# Patient Record
Sex: Male | Born: 2005 | Race: White | Hispanic: Yes | Marital: Single
Health system: Southern US, Community
[De-identification: ages and names within clinical notes are randomized; demographics above are authoritative.]

## PROBLEM LIST (undated history)

## (undated) DIAGNOSIS — J302 Other seasonal allergic rhinitis: Secondary | ICD-10-CM

## (undated) DIAGNOSIS — J45909 Unspecified asthma, uncomplicated: Secondary | ICD-10-CM

## (undated) DIAGNOSIS — K219 Gastro-esophageal reflux disease without esophagitis: Secondary | ICD-10-CM

## (undated) HISTORY — PX: OTHER SURGICAL HISTORY: SHX169

---
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
## 2023-08-01 IMAGING — CT CT HEAD W/O CM
4 series · 16 of 47 positions shown, 18 images · non-contrast
Comparison: 08/22/2021

CLINICAL DATA: Subdural hematoma follow-up

EXAM:
CT HEAD WITHOUT CONTRAST
TECHNIQUE: Contiguous axial images were obtained from the base of the skull
through the vertex without intravenous contrast.

[Series 2: head wo · axial · 0.41mm/px · z∈[+1516,+1631]mm · 7 of 31 slices shown, 9 images]
[im 4/31  brain]
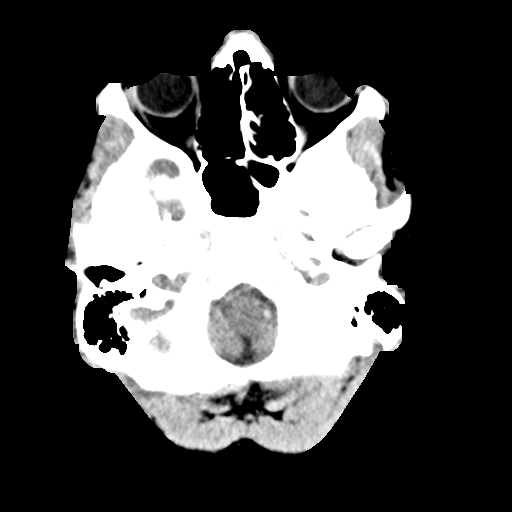
[im 4/31  bone]
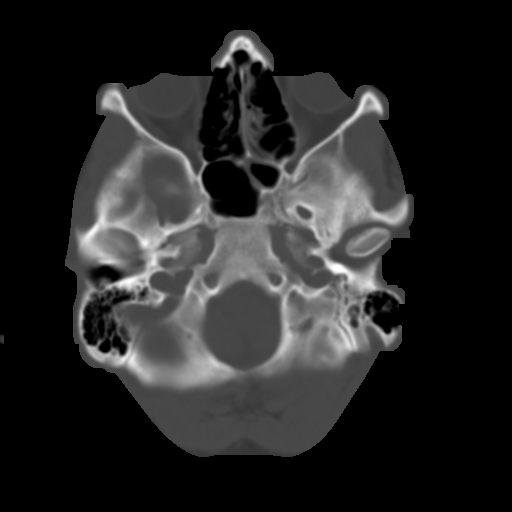
[im 8/31  brain]
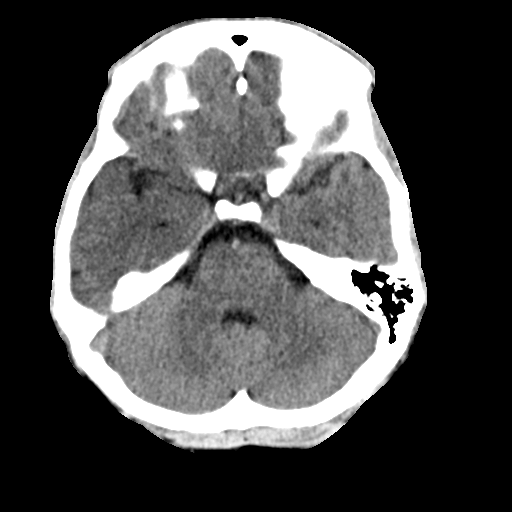
[im 12/31  brain]
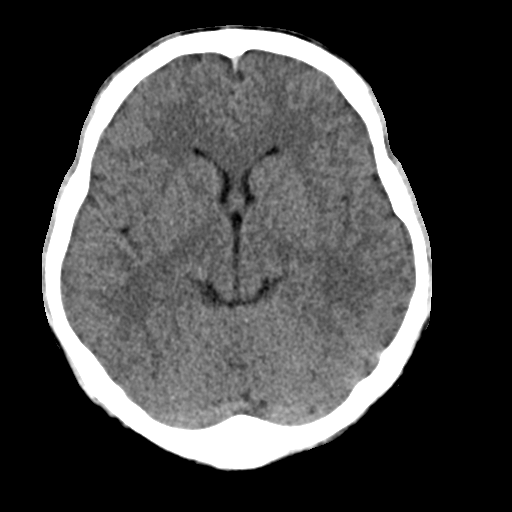
[im 16/31  brain]
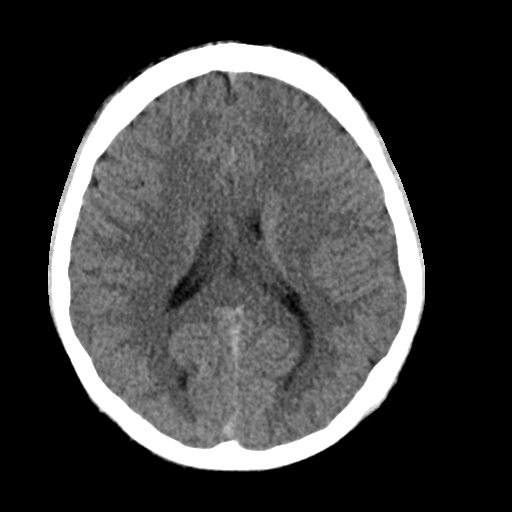
[im 19/31  brain]
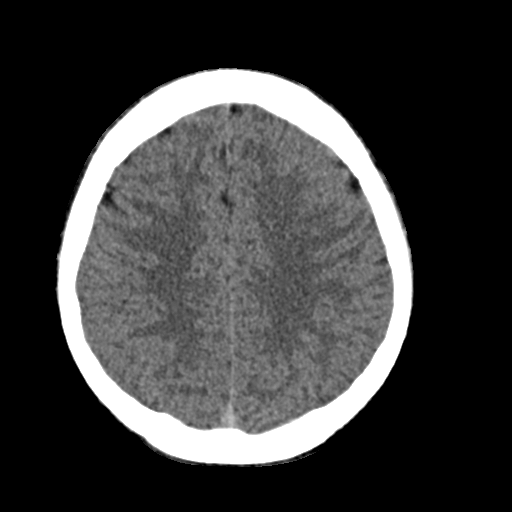
[im 19/31  bone]
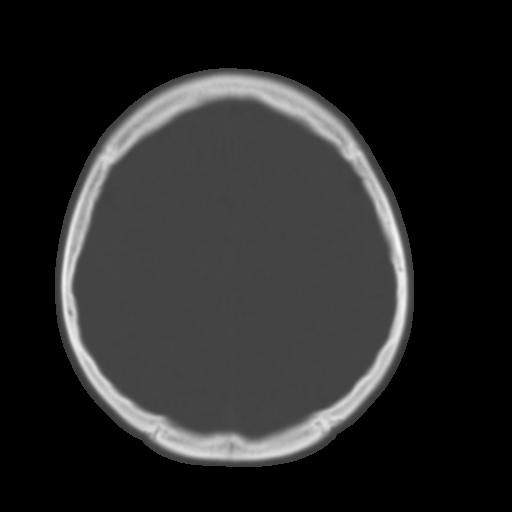
[im 23/31  brain]
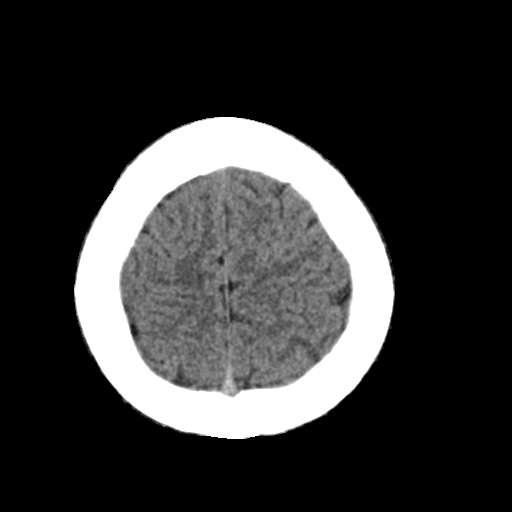
[im 27/31  brain]
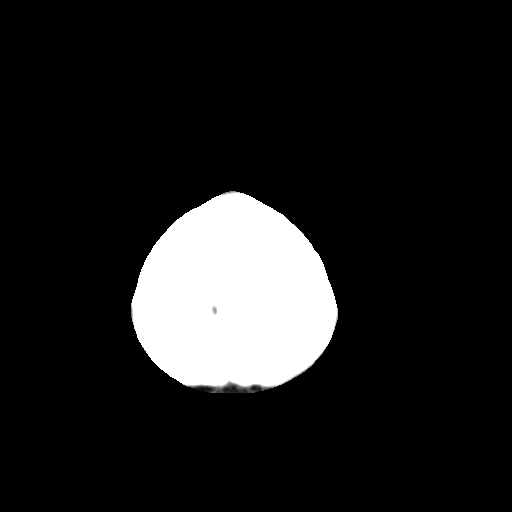

[Series 3: head bone · axial · 0.41mm/px · z∈[+1515,+1545]mm · 3 of 76 slices shown]
[im 8/76  bone]
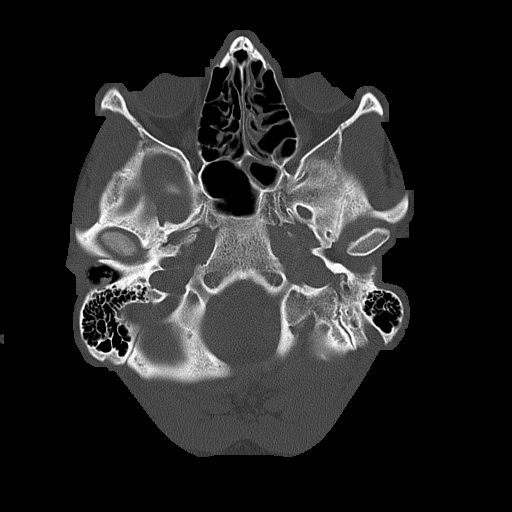
[im 16/76  bone]
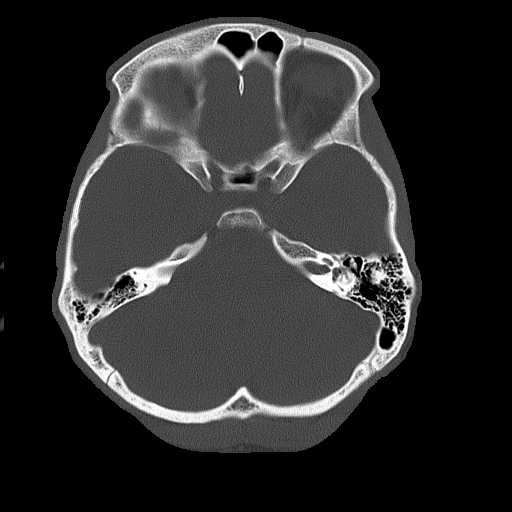
[im 23/76  bone]
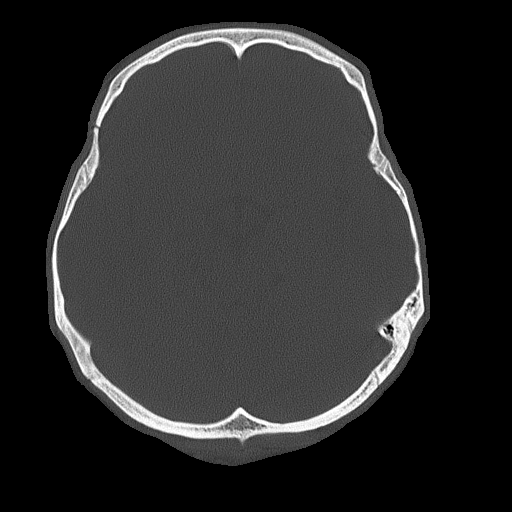

[Series 4: coronal soft tissue · coronal · 0.29mm/px · 3 of 67 slices shown]
[im 23/67  brain]
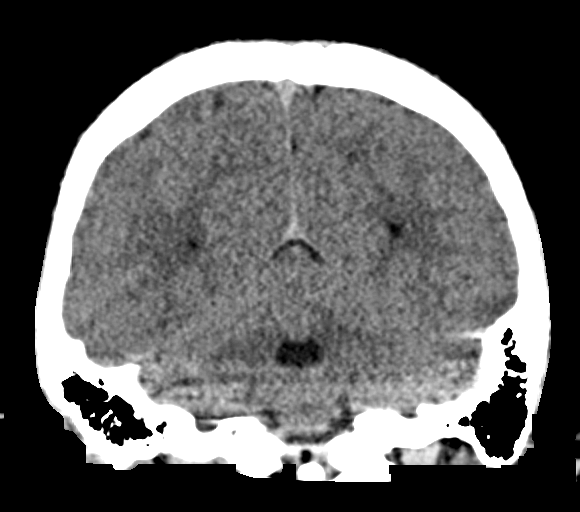
[im 30/67  brain]
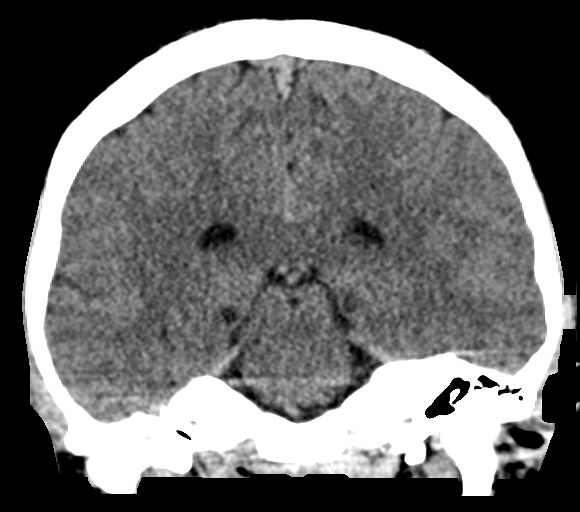
[im 37/67  brain]
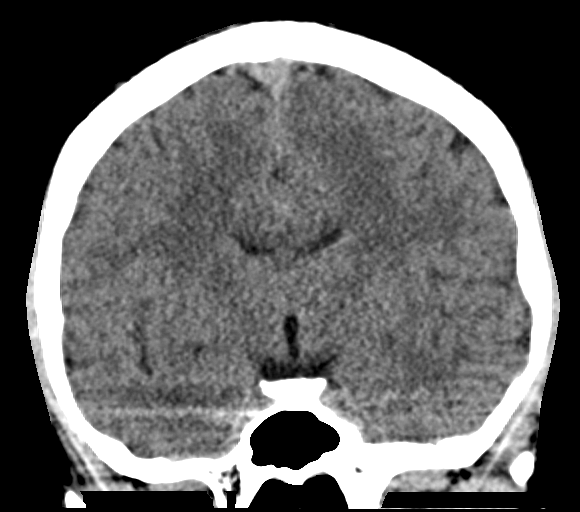

[Series 5: sagittal soft tissue · sagittal · 0.29mm/px · 3 of 57 slices shown]
[im 19/57  brain]
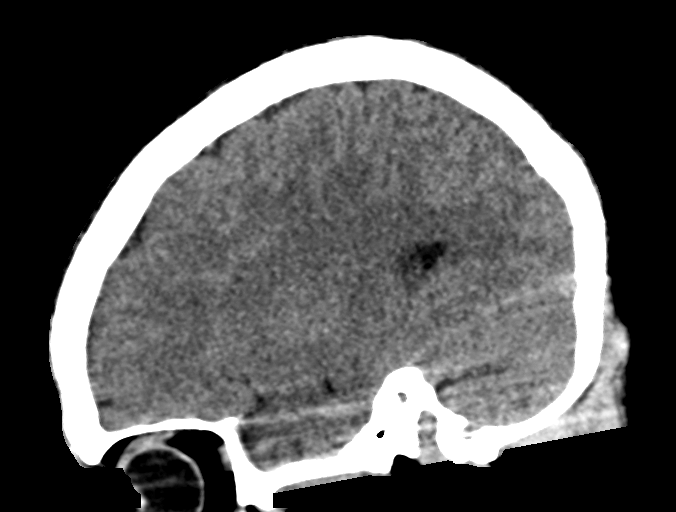
[im 29/57  brain]
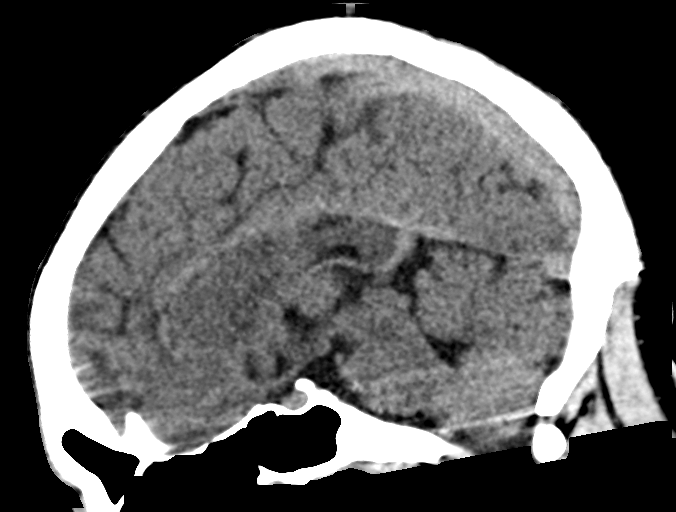
[im 38/57  brain]
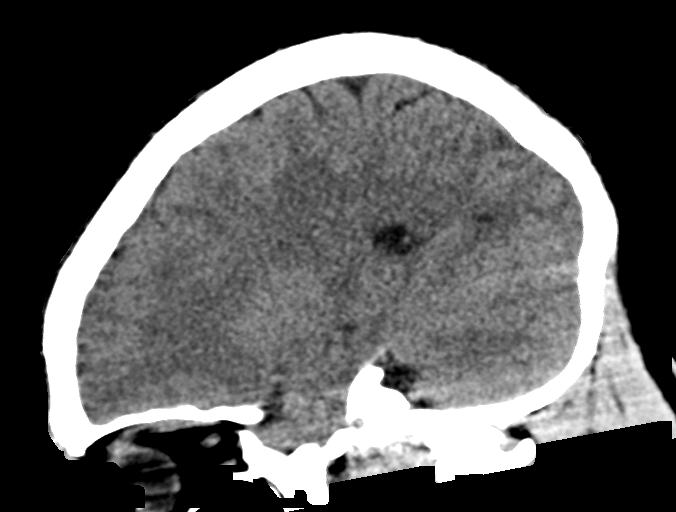

[16 of 47 positions shown; findings below may reference images not displayed]

FINDINGS: Brain: Decreased size of right extra-axial hemorrhage. The size and
configuration of the ventricles and extra-axial CSF spaces are
normal. The brain parenchyma is normal, without acute or chronic
infarction.

Vascular: No abnormal hyperdensity of the major intracranial
arteries or dural venous sinuses. No intracranial atherosclerosis.

Skull: The visualized skull base, calvarium and extracranial soft
tissues are normal.

Sinuses/Orbits: No fluid levels or advanced mucosal thickening of
the visualized paranasal sinuses. No mastoid or middle ear effusion.
The orbits are normal.
IMPRESSION: Decreased size of right extra-axial hemorrhage, nearly completely
resolved.
